# Patient Record
Sex: Male | Born: 1990 | Race: White | Hispanic: No | Marital: Married | Smoking: Current every day smoker
Health system: Southern US, Community
[De-identification: ages and names within clinical notes are randomized; demographics above are authoritative.]

## PROBLEM LIST (undated history)

## (undated) DIAGNOSIS — T63301A Toxic effect of unspecified spider venom, accidental (unintentional), initial encounter: Secondary | ICD-10-CM

## (undated) DIAGNOSIS — R45851 Suicidal ideations: Secondary | ICD-10-CM

## (undated) DIAGNOSIS — F329 Major depressive disorder, single episode, unspecified: Secondary | ICD-10-CM

## (undated) DIAGNOSIS — F32A Depression, unspecified: Secondary | ICD-10-CM

## (undated) HISTORY — DX: Toxic effect of unspecified spider venom, accidental (unintentional), initial encounter: T63.301A

---
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
## 2013-01-26 ENCOUNTER — Emergency Department (HOSPITAL_COMMUNITY)
Admission: EM | Admit: 2013-01-26 | Discharge: 2013-01-26 | Disposition: A | Discharge status: Home / Self Care | Payer: BC Managed Care – PPO | Attending: Emergency Medicine | Admitting: Emergency Medicine

## 2013-01-26 ENCOUNTER — Encounter (HOSPITAL_COMMUNITY): Payer: Self-pay | Admitting: Emergency Medicine

## 2013-01-26 DIAGNOSIS — K002 Abnormalities of size and form of teeth: Secondary | ICD-10-CM | POA: Insufficient documentation

## 2013-01-26 DIAGNOSIS — F329 Major depressive disorder, single episode, unspecified: Secondary | ICD-10-CM | POA: Insufficient documentation

## 2013-01-26 DIAGNOSIS — K0889 Other specified disorders of teeth and supporting structures: Secondary | ICD-10-CM

## 2013-01-26 DIAGNOSIS — F3289 Other specified depressive episodes: Secondary | ICD-10-CM | POA: Insufficient documentation

## 2013-01-26 DIAGNOSIS — F17200 Nicotine dependence, unspecified, uncomplicated: Secondary | ICD-10-CM | POA: Insufficient documentation

## 2013-01-26 DIAGNOSIS — K089 Disorder of teeth and supporting structures, unspecified: Secondary | ICD-10-CM | POA: Insufficient documentation

## 2013-01-26 MED ORDER — HYDROCODONE-ACETAMINOPHEN 5-325 MG PO TABS: 2.0000 | ORAL_TABLET | ORAL | Status: AC | PRN

## 2013-01-26 MED ORDER — IBUPROFEN 400 MG PO TABS
800.0000 mg | ORAL_TABLET | Freq: Once | ORAL | Status: AC
  Administered 2013-01-26: 800 mg via ORAL
  Filled 2013-01-26: qty 2

## 2013-01-26 MED ORDER — HYDROCODONE-ACETAMINOPHEN 7.5-325 MG/15ML PO SOLN: 10.0000 mL | Freq: Once | ORAL | Status: DC

## 2013-01-26 MED ORDER — PENICILLIN V POTASSIUM 500 MG PO TABS: 500.0000 mg | ORAL_TABLET | Freq: Four times a day (QID) | ORAL | Status: AC

## 2013-01-26 NOTE — ED Provider Notes (Signed)
CSN: [id number]     Arrival date & time 01/26/13  1340 History   First MD Initiated Contact with Patient 01/26/13 1414    This chart was scribed for Irish Elders NP, a non-physician practitioner working with Junius Argyle, MD by Lewanda Rife, ED Scribe. This patient was seen in room TR05C/TR05C and the patient's care was started at 2:53 PM     Chief Complaint  Patient presents with  . Dental Pain   (Consider location/radiation/quality/duration/timing/severity/associated sxs/prior Treatment) The history is provided by the patient. No language interpreter was used.   HPI Comments: Mark Owens is a 22 y.o. male who presents to the Emergency Department complaining of constant, but improving right lower quadrant dental pain onset yesterday. Reports dental pain is exacerbated by touch and alleviated by nothing. Denies associated fever, dysphagia, sore throat, chills, nausea, and emesis.   Past Medical History  Diagnosis Date  . Depressed   . Spider bite     right forearm  . Suicidal thoughts    History reviewed. No pertinent past surgical history. Family History  Problem Relation Age of Onset  . Fibromyalgia Mother   . Cancer Maternal Grandmother     osteoblastic  sarcoma  . Cancer Maternal Grandfather     prostate  . Heart disease Maternal Grandfather    History  Substance Use Topics  . Smoking status: Current Every Day Smoker -- 1.00 packs/day    Types: Cigarettes  . Smokeless tobacco: Not on file  . Alcohol Use: 10.8 oz/week    18 Cans of beer per week     Comment: Daily use of marijuana    Review of Systems  Constitutional: Negative for fever.  HENT: Positive for dental problem.   Psychiatric/Behavioral: Negative for confusion.   A complete 10 system review of systems was obtained and all systems are negative except as noted in the HPI and PMHx.  Allergies  Citrus  Home Medications   Current Outpatient Rx  Name  Route  Sig  Dispense  Refill  .  Benzocaine (ANBESOL MT)   Mouth/Throat   Use as directed 1 application in the mouth or throat daily as needed (gum or tooth pain.).         Marland Kitchen ibuprofen (ADVIL,MOTRIN) 200 MG tablet   Oral   Take 400 mg by mouth once.         . phenol (CHLORASEPTIC) 1.4 % LIQD   Mouth/Throat   Use as directed 5 sprays in the mouth or throat daily as needed for throat irritation / pain (for sore throat).         . QUEtiapine (SEROQUEL) 100 MG tablet   Oral   Take 100 mg by mouth 3 times/day as needed-between meals & bedtime.         Marland Kitchen EXPIRED: ARIPiprazole (ABILIFY) 5 MG tablet   Oral   Take 2.5 mg by mouth 2 (two) times daily in the am and at bedtime..         . EXPIRED: mirtazapine (REMERON SOL-TAB) 15 MG disintegrating tablet   Oral   Take 15 mg by mouth at bedtime.          BP 130/79  Pulse 78  Temp(Src) 97.8 F (36.6 C) (Oral)  Resp 20  SpO2 99% Physical Exam  Nursing note and vitals reviewed. Constitutional: He is oriented to person, place, and time. He appears well-developed and well-nourished. No distress.  HENT:  Head: Normocephalic and atraumatic.  Mouth/Throat: Uvula is midline,  oropharynx is clear and moist and mucous membranes are normal. No trismus in the jaw. Abnormal dentition. No dental abscesses. No oropharyngeal exudate, posterior oropharyngeal edema or posterior oropharyngeal erythema.    Eyes: Conjunctivae and EOM are normal.  Neck: Normal range of motion. Neck supple. No tracheal deviation present.  Cardiovascular: Normal rate and regular rhythm.   No murmur heard. Pulmonary/Chest: Effort normal and breath sounds normal. No respiratory distress.  Musculoskeletal: Normal range of motion.  Lymphadenopathy:    He has no cervical adenopathy.  Neurological: He is alert and oriented to person, place, and time.  Skin: Skin is warm and dry.  Psychiatric: He has a normal mood and affect. His behavior is normal.    ED Course  Procedures (including critical  care time)  COORDINATION OF CARE:  Nursing notes reviewed. Vital signs reviewed. Initial pt interview and examination performed.    Treatment plan initiated:Medications - No data to display   Initial diagnostic testing ordered.    Labs Review Labs Reviewed - No data to display Imaging Review No results found.  EKG Interpretation   None       MDM   1. Toothache    Tooth pain and swelling around gumline. Pen vk prescription given and dentistry follow-up information given. No difficulty swallowing or neck pain. No external facial swelling noted. Pt agrees to plan.  I personally performed the services described in this documentation, which was scribed in my presence. The recorded information has been reviewed and is accurate.    Irish Elders, NP 01/31/13 [phone]

## 2013-01-26 NOTE — ED Notes (Signed)
Rt lower tooth pain for a while worse yesterday states tooth is gone/ decayed

## 2013-02-04 NOTE — ED Provider Notes (Signed)
Medical screening examination/treatment/procedure(s) were performed by non-physician practitioner and as supervising physician I was immediately available for consultation/collaboration.  EKG Interpretation   None         Kinzy Weyers S Caesar Mannella, MD 02/04/13 1128 

## 2013-12-19 ENCOUNTER — Emergency Department (HOSPITAL_COMMUNITY): Payer: BC Managed Care – PPO

## 2013-12-19 ENCOUNTER — Encounter (HOSPITAL_COMMUNITY): Payer: Self-pay | Admitting: Emergency Medicine

## 2013-12-19 ENCOUNTER — Emergency Department (HOSPITAL_COMMUNITY)
Admission: EM | Admit: 2013-12-19 | Discharge: 2013-12-19 | Disposition: A | Discharge status: Home / Self Care | Payer: BC Managed Care – PPO | Attending: Emergency Medicine | Admitting: Emergency Medicine

## 2013-12-19 DIAGNOSIS — Z87828 Personal history of other (healed) physical injury and trauma: Secondary | ICD-10-CM | POA: Insufficient documentation

## 2013-12-19 DIAGNOSIS — Z720 Tobacco use: Secondary | ICD-10-CM | POA: Insufficient documentation

## 2013-12-19 DIAGNOSIS — F0781 Postconcussional syndrome: Secondary | ICD-10-CM | POA: Diagnosis not present

## 2013-12-19 DIAGNOSIS — G44309 Post-traumatic headache, unspecified, not intractable: Secondary | ICD-10-CM | POA: Insufficient documentation

## 2013-12-19 DIAGNOSIS — S0990XA Unspecified injury of head, initial encounter: Secondary | ICD-10-CM

## 2013-12-19 DIAGNOSIS — R51 Headache: Secondary | ICD-10-CM | POA: Diagnosis present
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
## 2013-12-19 NOTE — Discharge Instructions (Signed)
Concussion °A concussion is a brain injury. It is caused by: °· A hit to the head. °· A quick and sudden movement (jolt) of the head or neck. °A concussion is usually not life threatening. Even so, it can cause serious problems. If you had a concussion before, you may have concussion-like problems after a hit to your head. °HOME CARE °General Instructions °· Follow your doctor's directions carefully. °· Take medicines only as told by your doctor. °· Only take medicines your doctor says are safe. °· Do not drink alcohol until your doctor says it is okay. Alcohol and some drugs can slow down healing. They can also put you at risk for further injury. °· If you are having trouble remembering things, write them down. °· Try to do one thing at a time if you get distracted easily. For example, do not watch TV while making dinner. °· Talk to your family members or close friends when making important decisions. °· Follow up with your doctor as told. °· Watch your symptoms. Tell others to do the same. Serious problems can sometimes happen after a concussion. Older adults are more likely to have these problems. °· Tell your teachers, school nurse, school counselor, coach, athletic trainer, or work manager about your concussion. Tell them about what you can or cannot do. They should watch to see if: °¨ It gets even harder for you to pay attention or concentrate. °¨ It gets even harder for you to remember things or learn new things. °¨ You need more time than normal to finish things. °¨ You become annoyed (irritable) more than before. °¨ You are not able to deal with stress as well. °¨ You have more problems than before. °· Rest. Make sure you: °¨ Get plenty of sleep at night. °¨ Go to sleep early. °¨ Go to bed at the same time every day. Try to wake up at the same time. °¨ Rest during the day. °¨ Take naps when you feel tired. °· Limit activities where you have to think a lot or concentrate. These include: °¨ Doing  homework. °¨ Doing work related to a job. °¨ Watching TV. °¨ Using the computer. °Returning To Your Regular Activities °Return to your normal activities slowly, not all at once. You must give your body and brain enough time to heal.  °· Do not play sports or do other athletic activities until your doctor says it is okay. °· Ask your doctor when you can drive, ride a bicycle, or work other vehicles or machines. Never do these things if you feel dizzy. °· Ask your doctor about when you can return to work or school. °Preventing Another Concussion °It is very important to avoid another brain injury, especially before you have healed. In rare cases, another injury can lead to permanent brain damage, brain swelling, or death. The risk of this is greatest during the first 7-10 days after your injury. Avoid injuries by:  °· Wearing a seat belt when riding in a car. °· Not drinking too much alcohol. °· Avoiding activities that could lead to a second concussion (such as contact sports). °· Wearing a helmet when doing activities like: °¨ Biking. °¨ Skiing. °¨ Skateboarding. °¨ Skating. °· Making your home safer by: °¨ Removing things from the floor or stairways that could make you trip. °¨ Using grab bars in bathrooms and handrails by stairs. °¨ Placing non-slip mats on floors and in bathtubs. °¨ Improve lighting in dark areas. °GET HELP IF: °· It   gets even harder for you to pay attention or concentrate. °· It gets even harder for you to remember things or learn new things. °· You need more time than normal to finish things. °· You become annoyed (irritable) more than before. °· You are not able to deal with stress as well. °· You have more problems than before. °· You have problems keeping your balance. °· You are not able to react quickly when you should. °Get help if you have any of these problems for more than 2 weeks:  °· Lasting (chronic) headaches. °· Dizziness or trouble balancing. °· Feeling sick to your stomach  (nausea). °· Seeing (vision) problems. °· Being affected by noises or light more than normal. °· Feeling sad, low, down in the dumps, blue, gloomy, or empty (depressed). °· Mood changes (mood swings). °· Feeling of fear or nervousness about what may happen (anxiety). °· Feeling annoyed. °· Memory problems. °· Problems concentrating or paying attention. °· Sleep problems. °· Feeling tired all the time. °GET HELP RIGHT AWAY IF:  °· You have bad headaches or your headaches get worse. °· You have weakness (even if it is in one hand, leg, or part of the face). °· You have loss of feeling (numbness). °· You feel off balance. °· You keep throwing up (vomiting). °· You feel tired. °· One black center of your eye (pupil) is larger than the other. °· You twitch or shake violently (convulse). °· Your speech is not clear (slurred). °· You are more confused, easily angered (agitated), or annoyed than before. °· You have more trouble resting than before. °· You are unable to recognize people or places. °· You have neck pain. °· It is difficult to wake you up. °· You have unusual behavior changes. °· You pass out (lose consciousness). °MAKE SURE YOU:  °· Understand these instructions. °· Will watch your condition. °· Will get help right away if you are not doing well or get worse. °Document Released: 01/18/2009 Document Revised: 06/16/2013 Document Reviewed: 08/22/2012 °Exit[employer]® Patient Information ©2015 [employer]. This information is not intended to replace advice given to you by your health care provider. Make sure you discuss any questions you have with your health care provider. ° °
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
